# Patient Record
Sex: Male | Born: 1945 | Race: White | Hispanic: No | Smoking: Never smoker
Health system: Southern US, Community
[De-identification: ages and names within clinical notes are randomized; demographics above are authoritative.]

## PROBLEM LIST (undated history)

## (undated) DIAGNOSIS — E785 Hyperlipidemia, unspecified: Secondary | ICD-10-CM

## (undated) DIAGNOSIS — E119 Type 2 diabetes mellitus without complications: Secondary | ICD-10-CM

## (undated) HISTORY — PX: CHOLECYSTECTOMY: SHX55

## (undated) HISTORY — PX: HERNIA REPAIR: SHX51

## (undated) HISTORY — PX: CORONARY ARTERY BYPASS GRAFT: SHX141

## (undated) HISTORY — PX: APPENDECTOMY: SHX54

---

## 2004-09-13 ENCOUNTER — Ambulatory Visit: Payer: Self-pay | Admitting: Gastroenterology

## 2004-12-29 ENCOUNTER — Emergency Department: Payer: Self-pay | Admitting: Emergency Medicine

## 2006-09-26 ENCOUNTER — Emergency Department: Payer: Self-pay | Admitting: Emergency Medicine

## 2007-05-05 IMAGING — CR DG ANKLE COMPLETE 3+V*L*
1 series · 5 of 5 positions shown · non-contrast
Comparison: none

REASON FOR EXAM: Pain
COMMENTS:

[Series 1: view not recorded · 0.17mm/px · 5 of 5 slices shown]
[im 1/5]
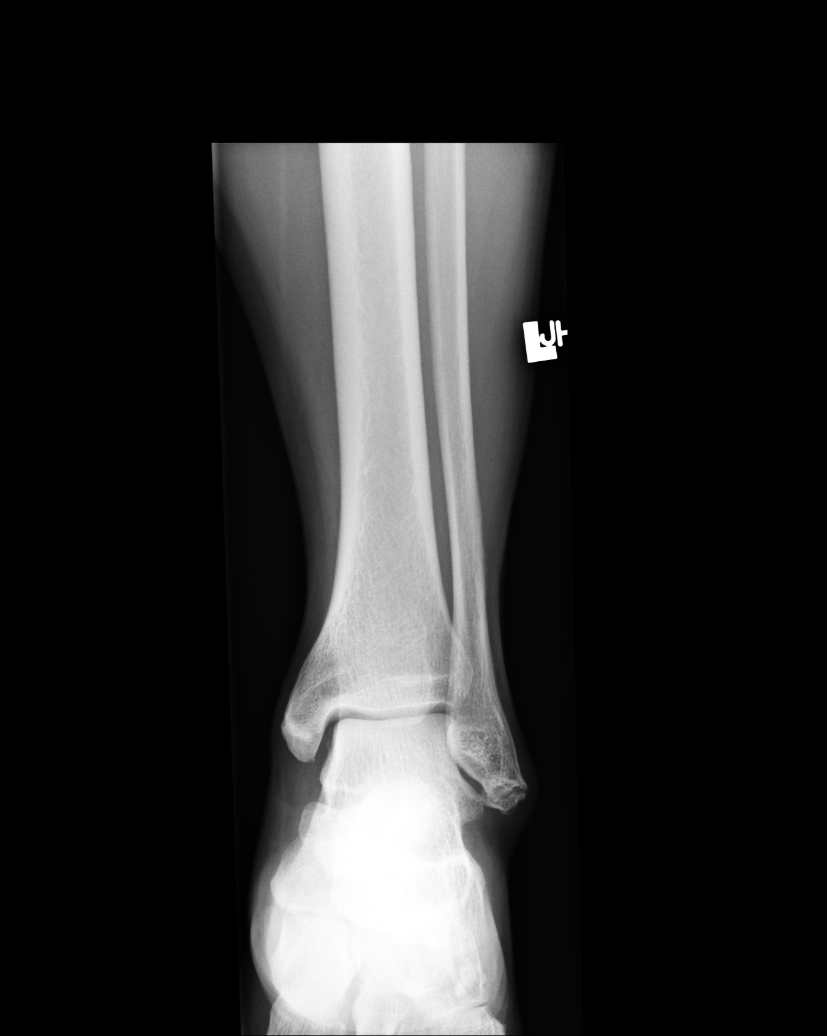
[im 2/5]
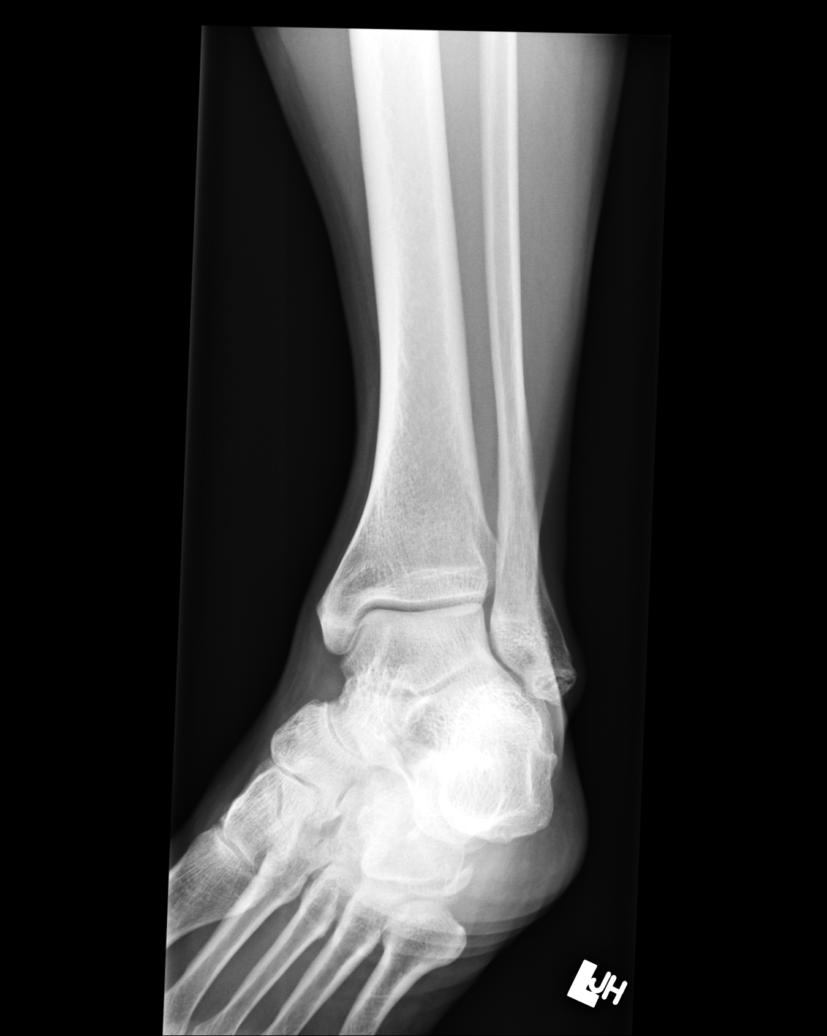
[im 3/5]
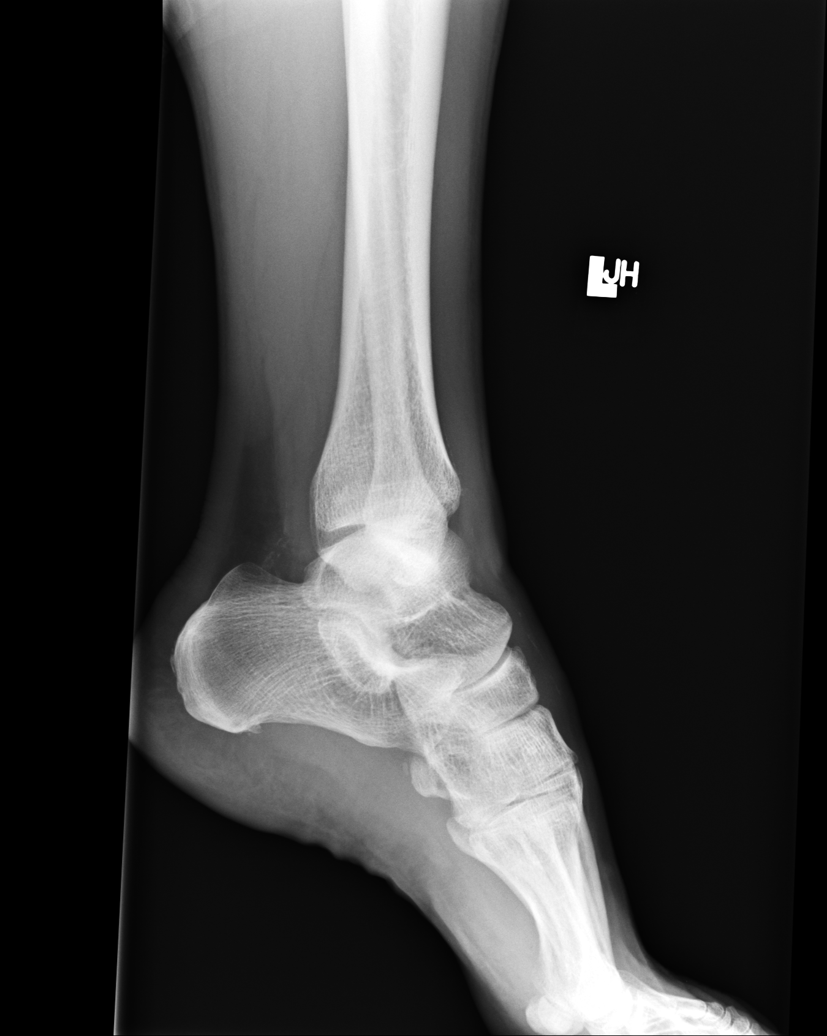
[im 4/5]
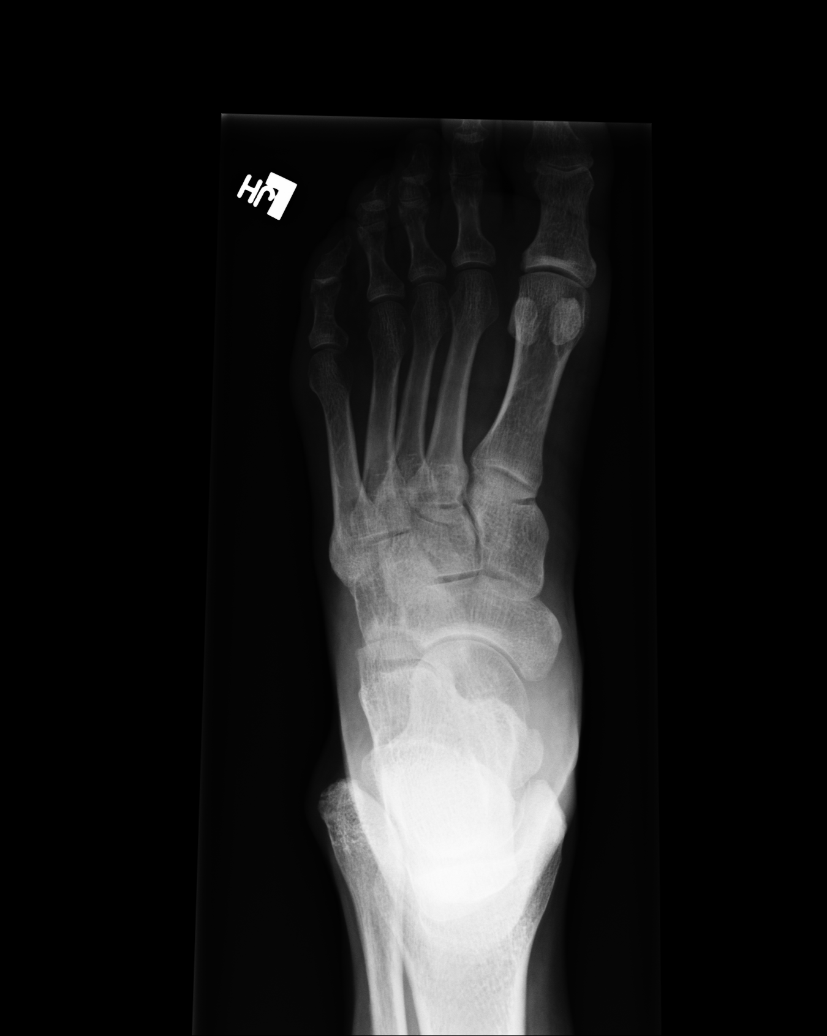
[im 5/5]
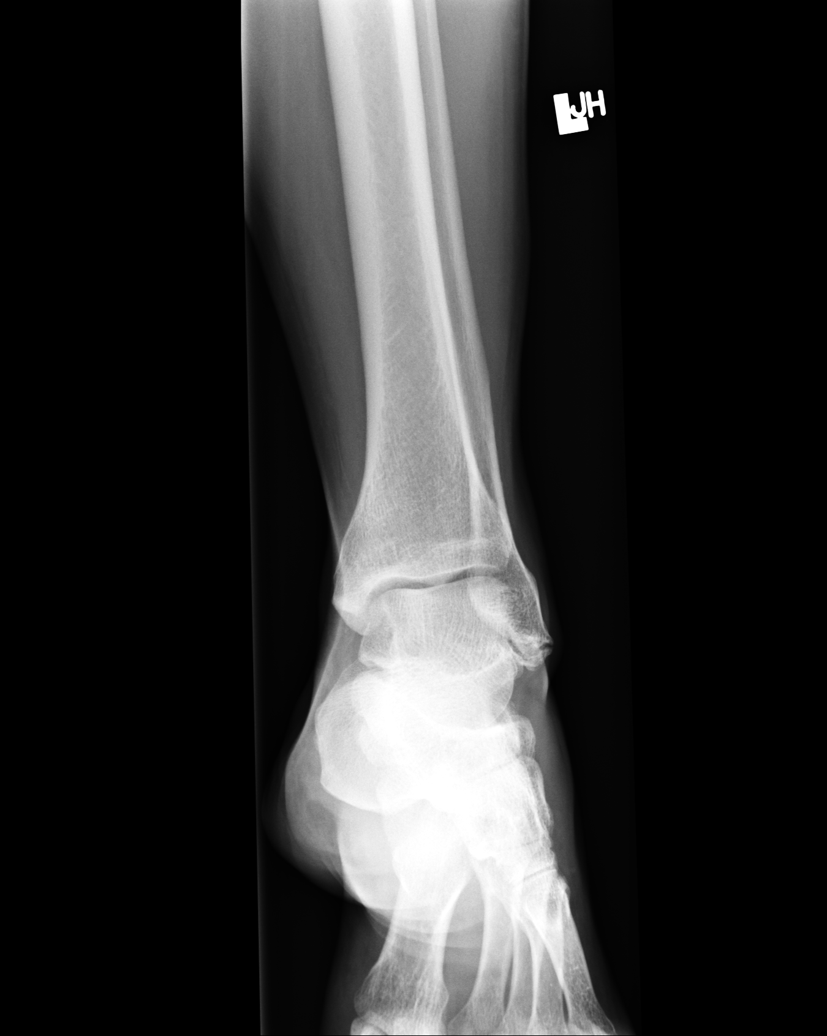

[5 of 5 positions shown; findings below may reference images not displayed]

PROCEDURE:     DXR - DXR ANKLE LEFT COMPLETE  - December 29, 2004 [DATE]

RESULT:          Multiple views of the LEFT ankle demonstrate degenerative
changes, possibly with a tiny avulsion at the tip of the lateral malleolus.
This may be old, given that it appears to be corticated.  Correlation with
symptoms and history would be recommended.  There does not appear to be
significant overlying soft tissue swelling.
IMPRESSION: Old avulsion at the tip of the lateral malleolus.  No
definite acute abnormality.

## 2008-09-12 ENCOUNTER — Ambulatory Visit: Payer: Self-pay | Admitting: Surgery

## 2008-09-20 ENCOUNTER — Ambulatory Visit: Payer: Self-pay | Admitting: Surgery

## 2009-11-01 DIAGNOSIS — I2510 Atherosclerotic heart disease of native coronary artery without angina pectoris: Secondary | ICD-10-CM | POA: Insufficient documentation

## 2011-01-17 IMAGING — CR DG CHEST 2V
1 series · 2 of 2 positions shown · non-contrast
Comparison: none

REASON FOR EXAM: [DATE]
COMMENTS:

PROCEDURE:     DXR - DXR CHEST PA (OR AP) AND LATERAL  - September 12, 2008  [DATE]
RESULT:     The lungs are adequately inflated. There is no focal infiltrate.
The heart is normal in size. The patient has undergone prior median
sternotomy. There is no pleural effusion.

[Series 1: view not recorded · 0.17mm/px · 2 of 2 slices shown]
[im 1/2]
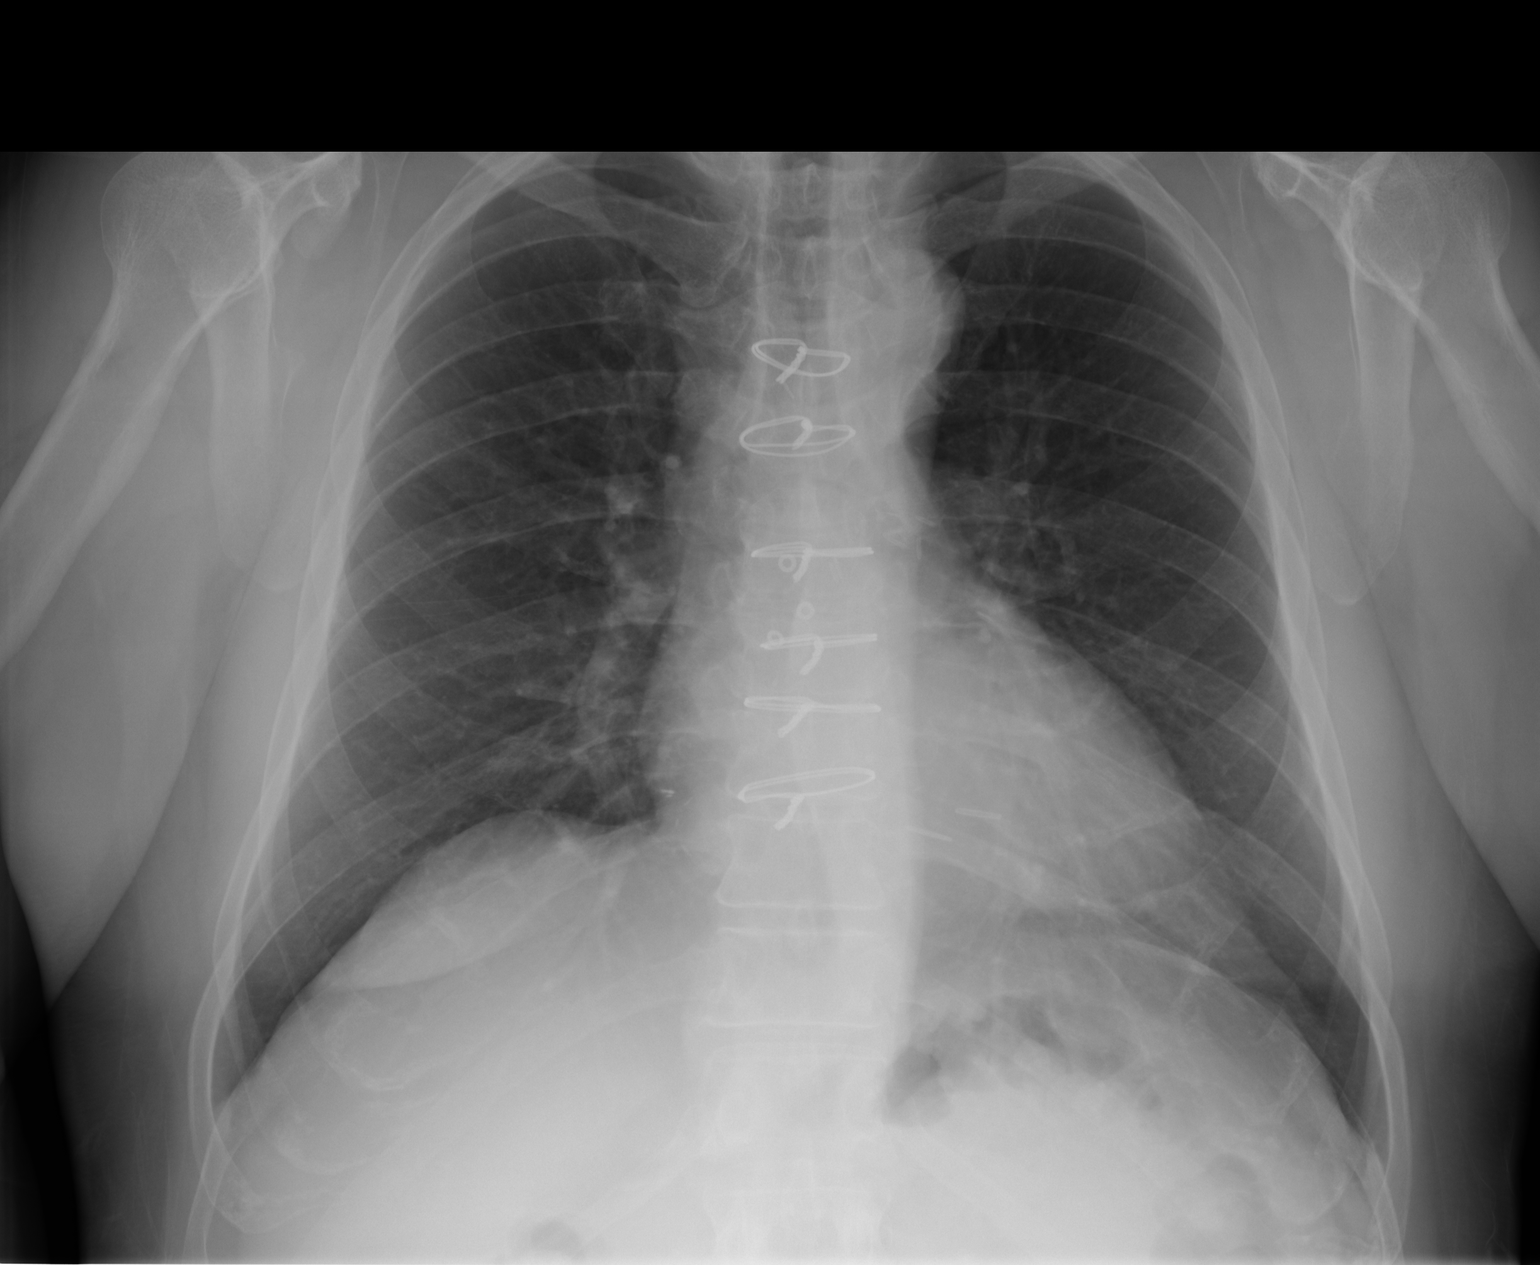
[im 2/2]
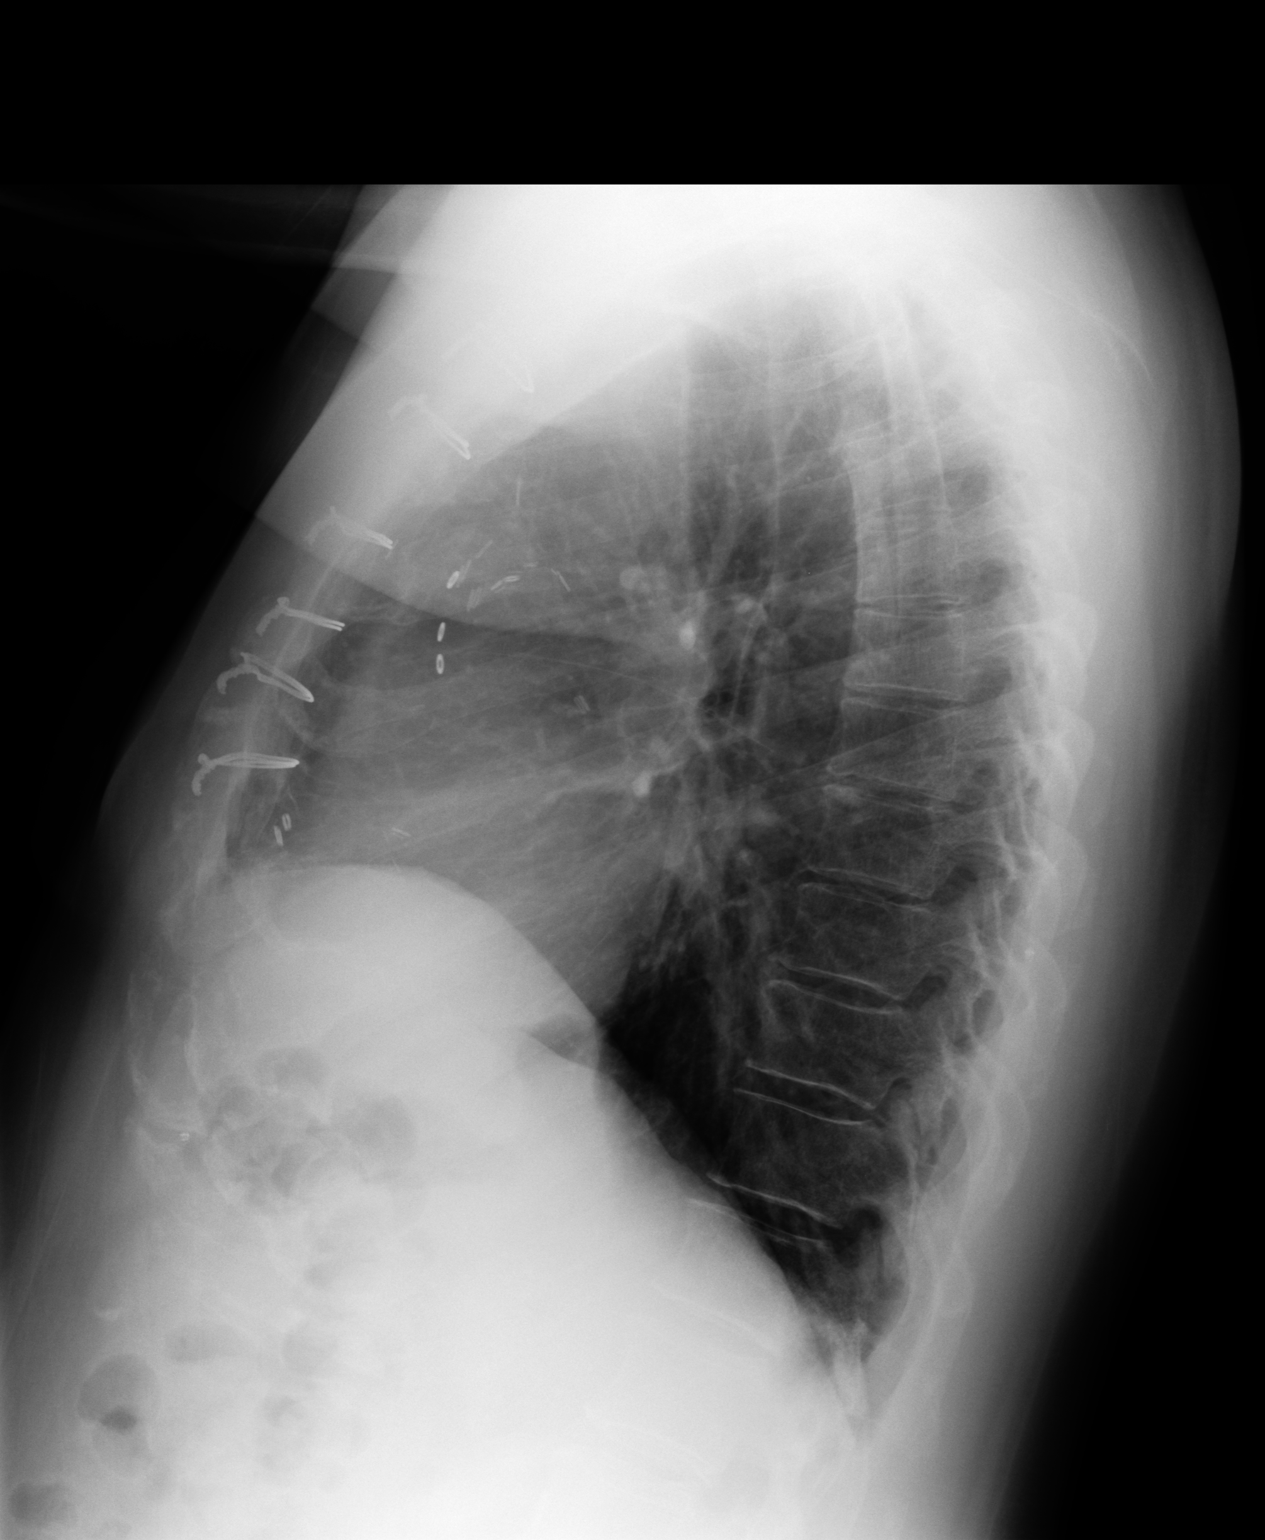

[2 of 2 positions shown; findings below may reference images not displayed]

IMPRESSION: I do not see evidence of CHF nor pneumonia nor other acute
cardiopulmonary abnormality.

## 2012-01-16 DIAGNOSIS — I252 Old myocardial infarction: Secondary | ICD-10-CM | POA: Insufficient documentation

## 2012-01-16 DIAGNOSIS — I5189 Other ill-defined heart diseases: Secondary | ICD-10-CM | POA: Insufficient documentation

## 2012-01-28 ENCOUNTER — Ambulatory Visit: Payer: Self-pay | Admitting: Family Medicine

## 2012-03-29 DIAGNOSIS — I6990 Unspecified sequelae of unspecified cerebrovascular disease: Secondary | ICD-10-CM | POA: Insufficient documentation

## 2012-03-29 DIAGNOSIS — Z951 Presence of aortocoronary bypass graft: Secondary | ICD-10-CM | POA: Insufficient documentation

## 2017-05-11 DIAGNOSIS — Z0000 Encounter for general adult medical examination without abnormal findings: Secondary | ICD-10-CM | POA: Insufficient documentation

## 2017-08-05 ENCOUNTER — Other Ambulatory Visit: Payer: Self-pay

## 2017-08-05 ENCOUNTER — Ambulatory Visit
Admission: EM | Admit: 2017-08-05 | Discharge: 2017-08-05 | Disposition: A | Discharge status: Home / Self Care | Payer: 59 | Attending: Emergency Medicine | Admitting: Emergency Medicine

## 2017-08-05 DIAGNOSIS — W57XXXA Bitten or stung by nonvenomous insect and other nonvenomous arthropods, initial encounter: Secondary | ICD-10-CM | POA: Diagnosis not present

## 2017-08-05 DIAGNOSIS — R21 Rash and other nonspecific skin eruption: Secondary | ICD-10-CM | POA: Diagnosis not present

## 2017-08-05 HISTORY — DX: Type 2 diabetes mellitus without complications: E11.9

## 2017-08-05 HISTORY — DX: Hyperlipidemia, unspecified: E78.5

## 2017-08-05 MED ORDER — DOXYCYCLINE HYCLATE 100 MG PO CAPS: 100.0000 mg | ORAL_CAPSULE | Freq: Two times a day (BID) | ORAL | 0 refills | Status: AC

## 2017-08-05 NOTE — Discharge Instructions (Addendum)
I am giving you information on [facility] spotted fever and Lyme disease so that you know what to look out for.  Finish the antibiotics, even if you feel better.  We did not test you today because the chances of it being falsely negative are fairly high because it is so soon after the tick bite and it would not change management.

## 2017-08-05 NOTE — ED Triage Notes (Signed)
Patient complains of a tick bite that occurred on Friday. Patient states that his wife removed the tick on Friday but think it had been on since Wednesday. Patient has area of bite with bullseye ring around it that he noticed today.

## 2017-08-05 NOTE — ED Provider Notes (Signed)
HPI  SUBJECTIVE:  Frederick Valdez. is a 72 y.o. male who presents with an annular erythematous nonpruritic, nonpainful, target lesion at the site of a tick bite to his posterior right torso.  Patient thinks that the tick was on for at least 48 hours, but his wife removed the tick 4 days ago.  He is not sure if it was engorged or not.  He states that he had surrounding erythema post removal but noticed a bull's-eye rash at the site of the tick bite today.  No flulike symptoms, body aches, headaches, fevers, neck stiffness, arthralgias, rash elsewhere, abdominal pain, chest pain, facial droop.  He has no complaints.  He tried an unknown ointment for itching, but the itching has resolved.  There are no other aggravating or alleviating factors.  The tick bite was sustained here [place].  No recent travel.  He has a past medical history of diabetes, MI status post quadruple bypass.  Is on antihypertensives but states that this is "to protect his heart".  No history of [facility] spotted fever, Lyme disease, MRSA.  PMD: Cannot remember the name.  Located in [place].    Past Medical History:  Diagnosis Date  . Diabetes mellitus (HCC)   . Hyperlipidemia     Past Surgical History:  Procedure Laterality Date  . APPENDECTOMY    . CHOLECYSTECTOMY    . CORONARY ARTERY BYPASS GRAFT    . HERNIA REPAIR      Family History  Problem Relation Age of Onset  . Pancreatic cancer Mother   . Heart disease Father     Social History   Tobacco Use  . Smoking status: Never Smoker  . Smokeless tobacco: Never Used  Substance Use Topics  . Alcohol use: Never    Frequency: Never  . Drug use: Never    No current facility-administered medications for this encounter.   Current Outpatient Medications:  .  amLODipine (NORVASC) 10 MG tablet, Take by mouth., Disp: , Rfl:  .  aspirin EC 81 MG tablet, Take by mouth., Disp: , Rfl:  .  atorvastatin (LIPITOR) 80 MG tablet, Take by mouth., Disp: ,  Rfl:  .  glucose blood (ONE TOUCH ULTRA TEST) test strip, USE TWICE DAILY AS DIRECTED, Disp: , Rfl:  .  insulin glargine (LANTUS) 100 UNIT/ML injection, Inject into the skin., Disp: , Rfl:  .  Insulin Pen Needle (BD PEN NEEDLE NANO U/F) 32G X 4 MM MISC, Use to inject insulin daily, Disp: , Rfl:  .  lisinopril (PRINIVIL,ZESTRIL) 20 MG tablet, Take by mouth., Disp: , Rfl:  .  metFORMIN (GLUCOPHAGE) 500 MG tablet, Take 1 tablet by mouth daily with breakfast, Disp: , Rfl:  .  nitroGLYCERIN (NITROSTAT) 0.4 MG SL tablet, Place under the tongue., Disp: , Rfl:  .  doxycycline (VIBRAMYCIN) 100 MG capsule, Take 1 capsule (100 mg total) by mouth 2 (two) times daily for 21 days., Disp: 42 capsule, Rfl: 0  Allergies  Allergen Reactions  . Niacin Other (See Comments)     ROS  As noted in HPI.   Physical Exam  BP (!) 151/74 (BP Location: Left Arm)   Pulse (!) 52   Temp 97.8 F (36.6 C) (Oral)   Resp 18   Ht 5\' 3"  (1.6 m)   Wt 185 lb (83.9 kg)   SpO2 99%   BMI 32.77 kg/m   Constitutional: Well developed, well nourished, no acute distress Eyes:  EOMI, conjunctiva normal bilaterally HENT: Normocephalic,  atraumatic,mucus membranes moist Respiratory: Normal inspiratory effort Cardiovascular: Normal rate GI: nondistended skin: +7.5 x 3 cm nontender blanchable circular rash with central clearing consistent with erythema migrans.  Central nontender tick bite with no expressible purulent drainage.  See picture.   Musculoskeletal: no deformities Neurologic: Alert & oriented x 3, no focal neuro deficits no facial droop. Psychiatric: Speech and behavior appropriate   ED Course   Medications - No data to display  No orders of the defined types were placed in this encounter.   No results found for this or any previous visit (from the past 24 hour(s)). No results found.  ED Clinical Impression  Tick bite, initial encounter  Rash   ED Assessment/Plan  Patient with erythema migrans  post tick bite.  It is too early to test for [facility] spotted fever and Lyme as it only has been 4 days, however, will be treating empirically for Lyme disease with doxycycline 100 mg p.o. twice daily for 3 weeks because of the rash which will also cover [facility] spotted fever.  Patient will need to follow-up with his primary care physician as needed.  Discussed  MDM, treatment plan, and plan for follow-up with patient. Discussed sn/sx that should prompt return to the ED. patient agrees with plan.   Meds ordered this encounter  Medications  . doxycycline (VIBRAMYCIN) 100 MG capsule    Sig: Take 1 capsule (100 mg total) by mouth 2 (two) times daily for 21 days.    Dispense:  42 capsule    Refill:  0    *This clinic note was created using Scientist, clinical (histocompatibility and immunogenetics)Dragon dictation software. Therefore, there may be occasional mistakes despite careful proofreading.   ?   Domenick GongMortenson, Jaley Yan, MD 08/05/17 1358

## 2021-07-13 ENCOUNTER — Ambulatory Visit (INDEPENDENT_AMBULATORY_CARE_PROVIDER_SITE_OTHER): Payer: Medicare HMO

## 2021-07-13 ENCOUNTER — Encounter: Payer: Self-pay | Admitting: Emergency Medicine

## 2021-07-13 ENCOUNTER — Ambulatory Visit
Admission: EM | Admit: 2021-07-13 | Discharge: 2021-07-13 | Disposition: A | Discharge status: Home / Self Care | Payer: Medicare HMO

## 2021-07-13 ENCOUNTER — Other Ambulatory Visit: Payer: Self-pay

## 2021-07-13 DIAGNOSIS — R059 Cough, unspecified: Secondary | ICD-10-CM

## 2021-07-13 DIAGNOSIS — J208 Acute bronchitis due to other specified organisms: Secondary | ICD-10-CM

## 2021-07-13 MED ORDER — AMOXICILLIN-POT CLAVULANATE 875-125 MG PO TABS: 1.0000 | ORAL_TABLET | Freq: Two times a day (BID) | ORAL | 0 refills | Status: DC

## 2021-07-13 MED ORDER — PREDNISONE 10 MG (21) PO TBPK: ORAL_TABLET | Freq: Every day | ORAL | 0 refills | Status: DC

## 2021-07-13 MED ORDER — ALBUTEROL SULFATE HFA 108 (90 BASE) MCG/ACT IN AERS: 1.0000 | INHALATION_SPRAY | Freq: Four times a day (QID) | RESPIRATORY_TRACT | 0 refills | Status: AC | PRN

## 2021-07-13 NOTE — ED Provider Notes (Signed)
[facility]    CSN: [id number] Arrival date & time: 07/13/21  1303      History   Chief Complaint Chief Complaint  Patient presents with   Cough    HPI Frederick Valdez. is a 76 y.o. male presenting with cough and congestion for 2 weeks.  History CAD, type 2 diabetes, hypertension.  Describes nonproductive cough, chest congestion for about 2 weeks.  There is no  nasal congestion, facial pressure, ear pain.  No shortness of breath, chest pain, new flank or back pain, fever/chills.  His PCP prescribed a cough syrup with minimal relief.  He denies history of COPD or asthma, never smoker.  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 Authorizing Provider  albuterol (VENTOLIN HFA) 108 (90 Base) MCG/ACT inhaler Inhale 1-2 puffs into the lungs every 6 (six) hours as needed for wheezing or shortness of breath. 07/13/21  Yes Rhys Martini, PA-C  amLODipine (NORVASC) 10 MG tablet Take by mouth. 02/17/17  Yes [provider]  amoxicillin-clavulanate (AUGMENTIN) 875-125 MG tablet Take 1 tablet by mouth every 12 (twelve) hours. Start the antibiotic if symptoms persist in 2 days (on Monday 6/5) 07/13/21  Yes Rhys Martini, PA-C  aspirin EC 81 MG  tablet Take by mouth. 12/25/14  Yes [provider]  atorvastatin (LIPITOR) 80 MG tablet Take by mouth. 02/17/17  Yes [provider]  insulin glargine (LANTUS) 100 UNIT/ML injection Inject into the skin. 07/03/16  Yes [provider]  lisinopril (PRINIVIL,ZESTRIL) 20 MG tablet Take by mouth. 02/17/17  Yes [provider]  metFORMIN (GLUCOPHAGE) 500 MG tablet Take 1 tablet by mouth daily with breakfast 02/17/17  Yes [provider]  predniSONE (STERAPRED UNI-PAK 21 TAB) 10 MG (21) TBPK tablet Take by mouth daily. Take 6 tabs by mouth daily  for 2 days, then 5 tabs for 2 days, then 4 tabs for 2 days, then 3 tabs for 2 days, 2 tabs for 2 days, then 1 tab by mouth daily for 2 days 07/13/21  Yes Rhys Martini, PA-C  chlorpheniramine-HYDROcodone 10-8 MG/5ML SMARTSIG:5 Milliliter(s) By Mouth Every 12 Hours PRN 07/05/21   [provider]  glucose blood (ONE TOUCH ULTRA TEST) test strip USE TWICE DAILY AS DIRECTED 02/18/16   [provider]  Insulin Pen Needle (BD PEN NEEDLE NANO U/F) 32G X 4 MM MISC Use to inject insulin daily 02/17/17   [provider]  nitroGLYCERIN (NITROSTAT) 0.4 MG SL tablet Place under the tongue. 07/10/17   [provider]    Family History Family History  Problem Relation Age of Onset   Pancreatic cancer Mother    Heart disease Father  Social History Social History   Tobacco Use   Smoking status: Never   Smokeless tobacco: Never  Vaping Use   Vaping Use: Never used  Substance Use Topics   Alcohol use: Never   Drug use: Never     Allergies   Niacin   Review of Systems Review of Systems  Constitutional:  Negative for appetite change, chills and fever.  HENT:  Positive for congestion. Negative for ear pain, rhinorrhea, sinus pressure, sinus pain and sore throat.   Eyes:  Negative for redness and visual disturbance.  Respiratory:  Positive for cough. Negative for chest tightness, shortness  of breath and wheezing.   Cardiovascular:  Negative for chest pain and palpitations.  Gastrointestinal:  Negative for abdominal pain, constipation, diarrhea, nausea and vomiting.  Genitourinary:  Negative for dysuria, frequency and urgency.  Musculoskeletal:  Negative for myalgias.  Neurological:  Negative for dizziness, weakness and headaches.  Psychiatric/Behavioral:  Negative for confusion.   All other systems reviewed and are negative.   Physical Exam Triage Vital Signs ED Triage Vitals  Enc Vitals Group     BP 07/13/21 1355 (!) 156/76     Pulse Rate 07/13/21 1355 80     Resp 07/13/21 1355 15     Temp 07/13/21 1355 98.3 F (36.8 C)     Temp Source 07/13/21 1355 Oral     SpO2 07/13/21 1355 97 %     Weight 07/13/21 1353 182 lb (82.6 kg)     Height 07/13/21 1353 5\' 3"  (1.6 m)     Head Circumference --      Peak Flow --      Pain Score 07/13/21 1353 0     Pain Loc --      Pain Edu? --      Excl. in GC? --    No data found.  Updated Vital Signs BP (!) 156/76 (BP Location: Right Arm)   Pulse 80   Temp 98.3 F (36.8 C) (Oral)   Resp 15   Ht 5\' 3"  (1.6 m)   Wt 182 lb (82.6 kg)   SpO2 97%   BMI 32.24 kg/m   Visual Acuity Right Eye Distance:   Left Eye Distance:   Bilateral Distance:    Right Eye Near:   Left Eye Near:    Bilateral Near:     Physical Exam Vitals reviewed.  Constitutional:      General: He is not in acute distress.    Appearance: Normal appearance. He is not ill-appearing.  HENT:     Head: Normocephalic and atraumatic.     Right Ear: Tympanic membrane, ear canal and external ear normal. No tenderness. No middle ear effusion. There is no impacted cerumen. Tympanic membrane is not perforated, erythematous, retracted or bulging.     Left Ear: Tympanic membrane, ear canal and external ear normal. No tenderness.  No middle ear effusion. There is no impacted cerumen. Tympanic membrane is not perforated, erythematous, retracted or bulging.     Nose:  Nose normal. No congestion.     Mouth/Throat:     Mouth: Mucous membranes are moist.     Pharynx: Uvula midline. No oropharyngeal exudate or posterior oropharyngeal erythema.  Eyes:     Extraocular Movements: Extraocular movements intact.     Pupils: Pupils are equal, round, and reactive to light.  Cardiovascular:     Rate and Rhythm: Normal rate and regular rhythm.     Heart sounds: Normal heart sounds.  Pulmonary:  Effort: Pulmonary effort is normal.     Breath sounds: Wheezing present. No decreased breath sounds, rhonchi or rales.     Comments: Expiratory wheezes throughout.  Oxygenating comfortably. Abdominal:     Palpations: Abdomen is soft.     Tenderness: There is no abdominal tenderness. There is no guarding or rebound.  Lymphadenopathy:     Cervical: No cervical adenopathy.     Right cervical: No superficial cervical adenopathy.    Left cervical: No superficial cervical adenopathy.  Neurological:     General: No focal deficit present.     Mental Status: He is alert and oriented to person, place, and time.  Psychiatric:        Mood and Affect: Mood normal.        Behavior: Behavior normal.        Thought Content: Thought content normal.        Judgment: Judgment normal.     UC Treatments / Results  Labs (all labs ordered are listed, but only abnormal results are displayed) Labs Reviewed - No data to display  EKG   Radiology DG Chest 2 View  Result Date: 07/13/2021 CLINICAL DATA:  Cough for 2 weeks. EXAM: CHEST - 2 VIEW COMPARISON:  Chest radiograph 09/12/2008 FINDINGS: Cardiomediastinal silhouette is stable. Heart size is normal. Median sternotomy wires. Lungs are clear except for thickening/density along the right minor fissure which could represent chronic changes. No pulmonary edema. No large pleural effusions. No acute bone abnormality. IMPRESSION: No active cardiopulmonary disease. Electronically Signed   By: Richarda Overlie M.D.   On: 07/13/2021 14:32     Procedures Procedures (including critical care time)  Medications Ordered in UC Medications - No data to display  Initial Impression / Assessment and Plan / UC Course  I have reviewed the triage vital signs and the nursing notes.  Pertinent labs & imaging results that were available during my care of the patient were reviewed by me and considered in my medical decision making (see chart for details).     This patient is a very pleasant 76 y.o. year old male presenting with viral bronchitis. Today this pt is afebrile nontachycardic nontachypneic, oxygenating well on room air, no wheezes rhonchi or rales. No history pulm ds.  CXR - No active cardiopulmonary disease.  His diabetes is currently well controlled, he states last A1c was 6.9.  We will manage as viral bronchitis with prednisone taper and albuterol inhaler.  If symptoms do not start to improve in 2 days, start the Augmentin.  He is in agreement.  ED return precautions discussed. Patient verbalizes understanding and agreement.    Final Clinical Impressions(s) / UC Diagnoses   Final diagnoses:  Viral bronchitis     Discharge Instructions      -Your chest xray looks normal.  -You have bronchitis. Bronchitis is an inflammation of the lining of your bronchial tubes, which carry air to and from your lungs. This typically occurs after a virus, like a cold virus. People who have bronchitis often cough up thickened mucus, which can be discolored. This isn't a bacterial infection, so you don't need antibiotics. We treat it with medications to help reduce inflammation and open up your lungs.  -Prednisone taper for cough/bronchitis. I recommend taking this in the morning as it could give you energy.  Avoid NSAIDs like ibuprofen and alleve while taking this medication as they can increase your risk of stomach upset and even GI bleeding when in combination with a steroid. You  can continue tylenol (acetaminophen) up to 1000mg  3x  daily. -Albuterol inhaler as needed for cough, wheezing, shortness of breath, 1 to 2 puffs every 6 hours as needed. -If symptoms persist in two days: -Start the antibiotic-Augmentin (amoxicillin-clavulanate), 1 pill every 12 hours for 7 days.  You can take this with food like with breakfast and dinner. -Follow-up with us or PCP if symptoms worsen/persist despite treatment.      ED Prescriptions     Medication Sig Dispense Auth. Provider   predniSONE (STERAPRED UNI-PAK 21 TAB) 10 MG (21) TBPK tablet Take by mouth daily. Take 6 tabs by mouth daily  for 2 days, then 5 tabs for 2 days, then 4 tabs for 2 days, then 3 tabs for 2 days, 2 tabs for 2 days, then 1 tab by mouth daily for 2 days 42 tablet Rhys MartiniGraham, Eon Zunker E, PA-C   amoxicillin-clavulanate (AUGMENTIN) 875-125 MG tablet Take 1 tablet by mouth every 12 (twelve) hours. Start the antibiotic if symptoms persist in 2 days (on Monday 6/5) 14 tablet Rhys MartiniGraham, Ilaisaane Marts E, PA-C   albuterol (VENTOLIN HFA) 108 (90 Base) MCG/ACT inhaler Inhale 1-2 puffs into the lungs every 6 (six) hours as needed for wheezing or shortness of breath. 1 each Rhys MartiniGraham, Ronaldo Crilly E, PA-C      PDMP not reviewed this encounter.   Rhys MartiniGraham, Loda Bialas E, PA-C 07/13/21 1511

## 2021-07-13 NOTE — ED Triage Notes (Signed)
Patient c/o cough and chest congestion for 2 weeks.  Patient states that he has had negative COVID test.  Patient was given cough medicine by his PCP.  Patient denies fevers.

## 2021-07-13 NOTE — Discharge Instructions (Addendum)
-  Your chest xray looks normal.  -You have bronchitis. Bronchitis is an inflammation of the lining of your bronchial tubes, which carry air to and from your lungs. This typically occurs after a virus, like a cold virus. People who have bronchitis often cough up thickened mucus, which can be discolored. This isn't a bacterial infection, so you don't need antibiotics. We treat it with medications to help reduce inflammation and open up your lungs.  -Prednisone taper for cough/bronchitis. I recommend taking this in the morning as it could give you energy.  Avoid NSAIDs like ibuprofen and alleve while taking this medication as they can increase your risk of stomach upset and even GI bleeding when in combination with a steroid. You can continue tylenol (acetaminophen) up to 1000mg  3x daily. -Albuterol inhaler as needed for cough, wheezing, shortness of breath, 1 to 2 puffs every 6 hours as needed. -If symptoms persist in two days: -Start the antibiotic-Augmentin (amoxicillin-clavulanate), 1 pill every 12 hours for 7 days.  You can take this with food like with breakfast and dinner. -Follow-up with or PCP if symptoms worsen/persist despite treatment.

## 2021-09-30 ENCOUNTER — Ambulatory Visit (INDEPENDENT_AMBULATORY_CARE_PROVIDER_SITE_OTHER): Payer: Medicare HMO

## 2021-09-30 ENCOUNTER — Ambulatory Visit
Admission: EM | Admit: 2021-09-30 | Discharge: 2021-09-30 | Disposition: A | Discharge status: Home / Self Care | Payer: Medicare HMO | Attending: Emergency Medicine | Admitting: Emergency Medicine

## 2021-09-30 DIAGNOSIS — R059 Cough, unspecified: Secondary | ICD-10-CM

## 2021-09-30 DIAGNOSIS — Z20822 Contact with and (suspected) exposure to covid-19: Secondary | ICD-10-CM | POA: Insufficient documentation

## 2021-09-30 DIAGNOSIS — R052 Subacute cough: Secondary | ICD-10-CM | POA: Insufficient documentation

## 2021-09-30 LAB — SARS CORONAVIRUS 2 BY RT PCR: SARS Coronavirus 2 by RT PCR: NEGATIVE

## 2021-09-30 MED ORDER — FAMOTIDINE 20 MG PO TABS: 20.0000 mg | ORAL_TABLET | Freq: Two times a day (BID) | ORAL | 0 refills | Status: DC

## 2021-09-30 MED ORDER — AEROCHAMBER MV MISC
1 refills | Status: AC
Start: 2021-09-30 — End: ?

## 2021-09-30 MED ORDER — FLUTICASONE PROPIONATE 50 MCG/ACT NA SUSP: 2.0000 | Freq: Every day | NASAL | 0 refills | Status: DC

## 2021-09-30 MED ORDER — PROMETHAZINE-DM 6.25-15 MG/5ML PO SYRP: 5.0000 mL | ORAL_SOLUTION | Freq: Four times a day (QID) | ORAL | 0 refills | Status: DC | PRN

## 2021-09-30 NOTE — ED Provider Notes (Signed)
HPI  SUBJECTIVE:  Frederick Valdez. is a 76 y.o. male who presents with a cough since 6/3, when he was diagnosed with bronchitis.  He was sent home with a prednisone taper and albuterol inhaler, Augmentin.  He states that the cough improved, but never completely resolved.  He got acutely worse 3 days ago.  States that he was unable to sleep at night secondary to the cough.  He reports a mild sore throat, and hoarseness but attributes this to coughing.  No known COVID exposure.  He got 3 doses of the COVID-vaccine.  He took Augmentin within the past 3 months for bronchitis.  No antipyretic in the past 6 hours.  He tried Delsym with some improvement in his symptoms.  Symptoms are worse with talking.  He has a past medical history of MI status post CABG, well-controlled diabetes, hypertension on lisinopril, hypercholesterolemia.  No history of CHF, cancer, GERD, pulmonary disease, smoking.  PCP: [facility]   His last A1c was 7.1 in April 2023    Past Medical History:  Diagnosis Date   Diabetes mellitus (HCC)    Hyperlipidemia     Past Surgical History:  Procedure Laterality Date   APPENDECTOMY     CHOLECYSTECTOMY     CORONARY ARTERY BYPASS GRAFT     HERNIA REPAIR      Family History  Problem Relation Age of Onset   Pancreatic cancer Mother    Heart disease Father     Social History   Tobacco Use   Smoking status: Never   Smokeless tobacco: Never  Vaping Use   Vaping Use: Never used  Substance Use Topics   Alcohol use: Never   Drug use: Never    No current facility-administered medications for this encounter.  Current Outpatient Medications:    amLODipine (NORVASC) 10 MG tablet, Take by mouth., Disp: , Rfl:    aspirin EC 81 MG tablet, Take by mouth., Disp: , Rfl:    atorvastatin (LIPITOR) 80 MG tablet, Take by mouth., Disp: , Rfl:    famotidine (PEPCID) 20 MG tablet, Take 1 tablet (20 mg total) by mouth 2 (two) times daily., Disp: 40 tablet, Rfl: 0   fluticasone  (FLONASE) 50 MCG/ACT nasal spray, Place 2 sprays into both nostrils daily., Disp: 16 g, Rfl: 0   glucose blood (ONE TOUCH ULTRA TEST) test strip, USE TWICE DAILY AS DIRECTED, Disp: , Rfl:    insulin glargine (LANTUS) 100 UNIT/ML injection, Inject into the skin., Disp: , Rfl:    Insulin Pen Needle (BD PEN NEEDLE NANO U/F) 32G X 4 MM MISC, Use to inject insulin daily, Disp: , Rfl:    lisinopril (PRINIVIL,ZESTRIL) 20 MG tablet, Take by mouth., Disp: , Rfl:    metFORMIN (GLUCOPHAGE) 500 MG tablet, Take 1 tablet by mouth daily with breakfast, Disp: , Rfl:    nitroGLYCERIN (NITROSTAT) 0.4 MG SL tablet, Place under the tongue., Disp: , Rfl:    promethazine-dextromethorphan (PROMETHAZINE-DM) 6.25-15 MG/5ML syrup, Take 5 mLs by mouth 4 (four) times daily as needed for cough., Disp: 118 mL, Rfl: 0   Spacer/Aero-Holding Chambers (AEROCHAMBER MV) inhaler, Use as instructed, Disp: 1 each, Rfl: 1   albuterol (VENTOLIN HFA) 108 (90 Base) MCG/ACT inhaler, Inhale 1-2 puffs into the lungs every 6 (six) hours as needed for wheezing or shortness of breath., Disp: 1 each, Rfl: 0  Allergies  Allergen Reactions   Niacin Other (See Comments)     ROS  As noted in HPI.  Physical Exam  BP (!) 143/63 (BP Location: Left Arm)   Pulse (!) 58   Temp 98.4 F (36.9 C) (Oral)   Ht 5\' 3"  (1.6 m)   Wt 79.4 kg   SpO2 97%   BMI 31.00 kg/m   Constitutional: Well developed, well nourished, no acute distress.  Coughing. Eyes:  EOMI, conjunctiva normal bilaterally HENT: Normocephalic, atraumatic,mucus membranes moist.  Positive nasal congestion.  Normal turbinates.  No maxillary, frontal sinus tenderness.  Unable to visualize oropharynx. Respiratory: Normal inspiratory effort lungs clear bilaterally, good air movement.  No anterior, lateral chest wall tenderness Cardiovascular: Normal rate, regular rhythm, no murmurs rubs or gallops GI: nondistended skin: No rash, skin intact Musculoskeletal: Calves symmetric,  nontender, no edema Neurologic: Alert & oriented x 3, no focal neuro deficits Psychiatric: Speech and behavior appropriate   ED Course   Medications - No data to display  Orders Placed This Encounter  Procedures   SARS Coronavirus 2 by RT PCR (hospital order, performed in [facility] hospital lab) *cepheid single result test* Anterior Nasal Swab    Standing Status:   Standing    Number of Occurrences:   1   DG Chest 2 View    Standing Status:   Standing    Number of Occurrences:   1    Order Specific Question:   Reason for Exam (SYMPTOM  OR DIAGNOSIS REQUIRED)    Answer:   Cough for month, month and a half.  Rule out bronchitis, pneumonia, pulmonary edema, pleural effusion   Ambulatory referral to Pulmonology    Referral Priority:   Routine    Referral Type:   Consultation    Referral Reason:   Specialty Services Required    Requested Specialty:   Pulmonary Disease    Number of Visits Requested:   1    Results for orders placed or performed during the hospital encounter of 09/30/21 (from the past 24 hour(s))  SARS Coronavirus 2 by RT PCR (hospital order, performed in [facility] hospital lab) *cepheid single result test* Anterior Nasal Swab     Status: None   Collection Time: 09/30/21  8:27 AM   Specimen: Anterior Nasal Swab  Result Value Ref Range   SARS Coronavirus 2 by RT PCR NEGATIVE NEGATIVE   DG Chest 2 View  Result Date: 09/30/2021 CLINICAL DATA:  Cough EXAM: CHEST - 2 VIEW COMPARISON:  07/13/2021 chest radiograph. FINDINGS: Intact sternotomy wires. Stable cardiomediastinal silhouette with normal heart size. No pneumothorax. No pleural effusion. Lungs appear clear, with no acute consolidative airspace disease and no pulmonary edema. IMPRESSION: No active cardiopulmonary disease. Electronically Signed   By: 09/12/2021 M.D.   On: 09/30/2021 08:38    ED Clinical Impression  1. Subacute cough      ED Assessment/Plan  Outside labs reviewed.  As noted in  HPI.  Patient with a cough.  In the differential includes infectious cause such as COVID, pneumonia, bronchitis, medication causes as he takes lisinopril, GERD.  Doubt CHF, PE.  Will check a chest x-ray and a COVID since he got acutely worse 3 days ago and he has multiple medical comorbidities.  Reviewed imaging independently.  No pulmonary edema, effusion, consolidative airspace disease.  See radiology report for full details.  Patient with a subacute cough.  Suspect postnasal drip, lisinopril, or GERD causing cough.  Chest x-ray is normal, COVID PCR is negative.  His lungs are clear.  He is satting 97% on room air.  We will try some  Promethazine DM, a spacer for his albuterol inhaler.  We can try some Pepcid.  He is to follow-up with his PCP and discuss lisinopril.  will refer to pulmonology for further evaluation.  Discussed labs, imaging, MDM, treatment plan, and plan for follow-up with patient. Discussed sn/sx that should prompt return to the ED. patient agrees with plan.   Meds ordered this encounter  Medications   fluticasone (FLONASE) 50 MCG/ACT nasal spray    Sig: Place 2 sprays into both nostrils daily.    Dispense:  16 g    Refill:  0   Spacer/Aero-Holding Chambers (AEROCHAMBER MV) inhaler    Sig: Use as instructed    Dispense:  1 each    Refill:  1   promethazine-dextromethorphan (PROMETHAZINE-DM) 6.25-15 MG/5ML syrup    Sig: Take 5 mLs by mouth 4 (four) times daily as needed for cough.    Dispense:  118 mL    Refill:  0   famotidine (PEPCID) 20 MG tablet    Sig: Take 1 tablet (20 mg total) by mouth 2 (two) times daily.    Dispense:  40 tablet    Refill:  0      *This clinic note was created using Scientist, clinical (histocompatibility and immunogenetics). Therefore, there may be occasional mistakes despite careful proofreading.  ?    Domenick Gong, MD 10/01/21 [phone]

## 2021-09-30 NOTE — ED Triage Notes (Signed)
Pt c/o cough, pt states he was seen in June for bronchitis and feels like it's been lingering since then, pt states cough has been worse in mornings but yesterday all day. Cough mostly dry

## 2021-09-30 NOTE — Discharge Instructions (Addendum)
Chest x-ray is normal.  If it is negative.  I suspect postnasal drip, your blood pressure medication, lisinopril, or acid reflux causing a cough.  I am sending you home Flonase for postnasal drip, Pepcid for possible acid reflux, Promethazine DM to help quiet your cough.  Please follow-up with your primary care provider to discuss your lisinopril ASAP.  You can take 2 puffs from your albuterol inhaler using your spacer every 4 hours as needed.  I have also put in a referral to pulmonology for further evaluation.  Go to the ER for the signs and symptoms we discussed

## 2022-02-28 ENCOUNTER — Ambulatory Visit
Admission: EM | Admit: 2022-02-28 | Discharge: 2022-02-28 | Disposition: A | Discharge status: Home / Self Care | Payer: Medicare HMO | Attending: Physician Assistant | Admitting: Physician Assistant

## 2022-02-28 ENCOUNTER — Ambulatory Visit (INDEPENDENT_AMBULATORY_CARE_PROVIDER_SITE_OTHER): Payer: Medicare HMO

## 2022-02-28 DIAGNOSIS — Z7984 Long term (current) use of oral hypoglycemic drugs: Secondary | ICD-10-CM | POA: Diagnosis not present

## 2022-02-28 DIAGNOSIS — R062 Wheezing: Secondary | ICD-10-CM | POA: Insufficient documentation

## 2022-02-28 DIAGNOSIS — R051 Acute cough: Secondary | ICD-10-CM | POA: Insufficient documentation

## 2022-02-28 DIAGNOSIS — E119 Type 2 diabetes mellitus without complications: Secondary | ICD-10-CM | POA: Diagnosis not present

## 2022-02-28 DIAGNOSIS — E785 Hyperlipidemia, unspecified: Secondary | ICD-10-CM | POA: Diagnosis not present

## 2022-02-28 DIAGNOSIS — I10 Essential (primary) hypertension: Secondary | ICD-10-CM | POA: Insufficient documentation

## 2022-02-28 DIAGNOSIS — Z1152 Encounter for screening for COVID-19: Secondary | ICD-10-CM | POA: Insufficient documentation

## 2022-02-28 DIAGNOSIS — Z794 Long term (current) use of insulin: Secondary | ICD-10-CM | POA: Insufficient documentation

## 2022-02-28 DIAGNOSIS — B338 Other specified viral diseases: Secondary | ICD-10-CM

## 2022-02-28 DIAGNOSIS — I252 Old myocardial infarction: Secondary | ICD-10-CM | POA: Insufficient documentation

## 2022-02-28 DIAGNOSIS — B974 Respiratory syncytial virus as the cause of diseases classified elsewhere: Secondary | ICD-10-CM | POA: Insufficient documentation

## 2022-02-28 DIAGNOSIS — R0981 Nasal congestion: Secondary | ICD-10-CM | POA: Diagnosis not present

## 2022-02-28 DIAGNOSIS — I2510 Atherosclerotic heart disease of native coronary artery without angina pectoris: Secondary | ICD-10-CM | POA: Insufficient documentation

## 2022-02-28 LAB — RESP PANEL BY RT-PCR (RSV, FLU A&B, COVID)  RVPGX2
Influenza A by PCR: NEGATIVE
Influenza B by PCR: NEGATIVE
Resp Syncytial Virus by PCR: POSITIVE — AB
SARS Coronavirus 2 by RT PCR: NEGATIVE

## 2022-02-28 MED ORDER — PROMETHAZINE-DM 6.25-15 MG/5ML PO SYRP: 5.0000 mL | ORAL_SOLUTION | Freq: Four times a day (QID) | ORAL | 0 refills | Status: DC | PRN

## 2022-02-28 NOTE — ED Provider Notes (Signed)
[facility]    CSN: [id number] Arrival date & time: 02/28/22  0827      History   Chief Complaint Chief Complaint  Patient presents with   Cough   Nasal Congestion    HPI Frederick Valdez. is a 77 y.o. male presenting for cough, congestion, sore throat, wheezing, fatigue, diarrhea, and aches.  Symptom onset was yesterday with symptoms worsening last night.  He denies fever, shortness of breath, vomiting.  No sick contacts.  Taking Delsym without relief.  Patient believes he may have bronchitis.  States he felt similarly last year.  No other concerns.  Medical history significant for diabetes, previous MI, CAD, hypertension, and hyperlipidemia.  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 Authorizing Provider  amLODipine (NORVASC) 10 MG tablet Take by mouth. 02/17/17  Yes [provider]  aspirin EC 81 MG tablet Take by mouth. 12/25/14  Yes [provider]  atorvastatin (LIPITOR) 80 MG tablet Take by mouth. 02/17/17  Yes [provider]  glucose blood (ONE TOUCH ULTRA TEST) test strip USE TWICE DAILY AS DIRECTED 02/18/16  Yes [provider]  insulin glargine (LANTUS) 100 UNIT/ML injection Inject into the  skin. 07/03/16  Yes [provider]  Insulin Pen Needle (BD PEN NEEDLE NANO U/F) 32G X 4 MM MISC Use to inject insulin daily 02/17/17  Yes [provider]  lisinopril (PRINIVIL,ZESTRIL) 20 MG tablet Take by mouth. 02/17/17  Yes [provider]  metFORMIN (GLUCOPHAGE) 500 MG tablet Take 1 tablet by mouth daily with breakfast 02/17/17  Yes [provider]  nitroGLYCERIN (NITROSTAT) 0.4 MG SL tablet Place under the tongue. 07/10/17  Yes [provider]  tamsulosin (FLOMAX) 0.4 MG CAPS capsule Take 0.4 mg by mouth daily.   Yes [provider]  traZODone (DESYREL) 50 MG tablet Take 50 mg by mouth at bedtime.   Yes [provider]  albuterol (VENTOLIN HFA) 108 (90 Base) MCG/ACT inhaler Inhale 1-2 puffs into the lungs every 6 (six) hours as needed for wheezing or shortness of breath. 07/13/21   [name], PA-C  famotidine (PEPCID) 20 MG tablet Take 1 tablet (20 mg total) by mouth 2 (two) times daily. 09/30/21   [name], MD  fluticasone (FLONASE) 50 MCG/ACT nasal spray Place 2 sprays into both nostrils daily. 09/30/21   [name], MD  promethazine-dextromethorphan (PROMETHAZINE-DM) 6.25-15 MG/5ML syrup Take 5 mLs by mouth 4 (four) times daily as needed for cough. 02/28/22   Danton Clap, PA-C  Spacer/Aero-Holding Chambers (AEROCHAMBER MV) inhaler Use as instructed 09/30/21   [name], MD    Family History Family History  Problem Relation Age of Onset   Pancreatic  cancer Mother    Heart disease Father     Social History Social History   Tobacco Use   Smoking status: Never   Smokeless tobacco: Never  Vaping Use   Vaping Use: Never used  Substance Use Topics   Alcohol use: Never   Drug use: Never     Allergies   Niacin   Review of Systems Review of Systems  Constitutional:  Positive for fatigue. Negative for fever.  HENT:  Positive for congestion, rhinorrhea and sore throat. Negative for sinus pressure  and sinus pain.   Respiratory:  Positive for cough and wheezing. Negative for shortness of breath.   Cardiovascular:  Negative for chest pain.  Gastrointestinal:  Positive for diarrhea. Negative for abdominal pain, nausea and vomiting.  Musculoskeletal:  Positive for myalgias.  Neurological:  Negative for weakness, light-headedness and headaches.  Hematological:  Negative for adenopathy.     Physical Exam Triage Vital Signs ED Triage Vitals  Enc Vitals Group     BP      Pulse      Resp      Temp      Temp src      SpO2      Weight      Height      Head Circumference      Peak Flow      Pain Score      Pain Loc      Pain Edu?      Excl. in GC?    No data found.  Updated Vital Signs BP (!) 149/70 (BP Location: Left Arm)   Pulse 61   Temp 98 F (36.7 C) (Oral)   Resp 18   Ht 5\' 3"  (1.6 m)   Wt 175 lb (79.4 kg)   SpO2 100%   BMI 31.00 kg/m      Physical Exam Vitals and nursing note reviewed.  Constitutional:      General: He is not in acute distress.    Appearance: Normal appearance. He is well-developed. He is not ill-appearing.  HENT:     Head: Normocephalic and atraumatic.     Nose: Congestion present.     Mouth/Throat:     Mouth: Mucous membranes are moist.     Pharynx: Oropharynx is clear.  Eyes:     General: No scleral icterus.    Conjunctiva/sclera: Conjunctivae normal.  Cardiovascular:     Rate and Rhythm: Normal rate and regular rhythm.  Pulmonary:     Effort: Pulmonary effort is normal. No respiratory distress.     Breath sounds: Normal breath sounds. No wheezing, rhonchi or rales.  Musculoskeletal:     Cervical back: Neck supple.  Skin:    General: Skin is warm and dry.     Capillary Refill: Capillary refill takes less than 2 seconds.  Neurological:     General: No focal deficit present.     Mental Status: He is alert. Mental status is at baseline.     Motor: No weakness.     Gait: Gait normal.  Psychiatric:        Mood and Affect: Mood  normal.        Behavior: Behavior normal.      UC Treatments / Results  Labs (all labs ordered are listed, but only abnormal results are displayed) Labs Reviewed  RESP PANEL BY RT-PCR (RSV, FLU A&B, COVID)  RVPGX2 - Abnormal; Notable for the following components:      Result Value  Resp Syncytial Virus by PCR POSITIVE (*)    All other components within normal limits    EKG   Radiology DG Chest 2 View  Result Date: 02/28/2022 CLINICAL DATA:  Cough, congestion, and wheezing for 1 day EXAM: CHEST - 2 VIEW COMPARISON:  Chest x-ray September 30, 2021 FINDINGS: The cardiomediastinal silhouette is unchanged in contour. Unchanged focal linear opacity in the lingula, likely atelectasis/scarring. No new focal pulmonary opacity. No pleural effusion or pneumothorax. The visualized upper abdomen is unremarkable. No acute osseous abnormality. IMPRESSION: No acute cardiopulmonary abnormality. Electronically Signed   By: Beryle Flock M.D.   On: 02/28/2022 09:18    Procedures Procedures (including critical care time)  Medications Ordered in UC Medications - No data to display  Initial Impression / Assessment and Plan / UC Course  I have reviewed the triage vital signs and the nursing notes.  Pertinent labs & imaging results that were available during my care of the patient were reviewed by me and considered in my medical decision making (see chart for details).   77 year old male presents for fatigue, cough, congestion, sore throat and wheezing that began yesterday.  No fever or breathing difficulty.  He is afebrile and overall well-appearing.  No acute distress.  On exam he has nasal congestion.  Throat is clear.  Chest clear to auscultation and heart regular rate and rhythm.  Respiratory panel and chest x-ray obtained.  Chest x-ray without any acute cardiopulmonary abnormality.   +RSV.  Sent Promethazine DM to pharmacy.  He says that he received this cough medication last year and it  was very helpful to him.  Encouraged him to increase his rest and fluid intake.  Advised patient he could be sick for couple of weeks but if he develops a fever or shortness of breath to return.  Reviewed return and ER precautions thoroughly.  Final Clinical Impressions(s) / UC Diagnoses   Final diagnoses:  RSV (respiratory syncytial virus infection)  Acute cough  Nasal congestion  Wheezing     Discharge Instructions      -Your chest x-ray does not show any evidence of pneumonia. - You do have RSV which is a respiratory viral infection.  You may be sick for couple of weeks.  If you start to have a fever or shortness of breath you should return, otherwise take the cough medication and rest and increase your fluids.  URI/COLD SYMPTOMS: Your exam today is consistent with a viral illness. Antibiotics are not indicated at this time. Use medications as directed, including cough syrup, nasal saline, and decongestants. Your symptoms should improve over the next few days and resolve within 7-10 days. Increase rest and fluids. F/u if symptoms worsen or predominate such as sore throat, ear pain, productive cough, shortness of breath, or if you develop high fevers or worsening fatigue over the next several days.       ED Prescriptions     Medication Sig Dispense Auth. Provider   promethazine-dextromethorphan (PROMETHAZINE-DM) 6.25-15 MG/5ML syrup Take 5 mLs by mouth 4 (four) times daily as needed for cough. 118 mL Danton Clap, PA-C      PDMP not reviewed this encounter.   Danton Clap, PA-C 02/28/22 [phone]

## 2022-02-28 NOTE — Discharge Instructions (Signed)
-  Your chest x-ray does not show any evidence of pneumonia. - You do have RSV which is a respiratory viral infection.  You may be sick for couple of weeks.  If you start to have a fever or shortness of breath you should return, otherwise take the cough medication and rest and increase your fluids.  URI/COLD SYMPTOMS: Your exam today is consistent with a viral illness. Antibiotics are not indicated at this time. Use medications as directed, including cough syrup, nasal saline, and decongestants. Your symptoms should improve over the next few days and resolve within 7-10 days. Increase rest and fluids. F/u if symptoms worsen or predominate such as sore throat, ear pain, productive cough, shortness of breath, or if you develop high fevers or worsening fatigue over the next several days.

## 2022-02-28 NOTE — ED Triage Notes (Signed)
Pt c/o possible bronchitis  Pt is having cough, sore throat, congestion, rattling in lungs at night when laying down x1day  Pt is taking delsym and states it is not working.

## 2022-09-04 ENCOUNTER — Telehealth: Payer: Medicare HMO | Admitting: Physician Assistant

## 2022-09-04 DIAGNOSIS — J209 Acute bronchitis, unspecified: Secondary | ICD-10-CM

## 2022-09-04 MED ORDER — DOXYCYCLINE HYCLATE 100 MG PO TABS: 100.0000 mg | ORAL_TABLET | Freq: Two times a day (BID) | ORAL | 0 refills | Status: AC

## 2022-09-04 MED ORDER — BENZONATATE 100 MG PO CAPS: 100.0000 mg | ORAL_CAPSULE | Freq: Three times a day (TID) | ORAL | 0 refills | Status: AC | PRN

## 2022-09-04 NOTE — Patient Instructions (Signed)
Frederick Bills., thank you for joining Piedad Climes, PA-C for today's virtual visit.  While this provider is not your primary care provider (PCP), if your PCP is located in our provider database this encounter information will be shared with them immediately following your visit.   A [facility] MyChart account gives you access to today's visit and all your visits, tests, and labs performed at [facility] " click here if you don't have a [facility] MyChart account or go to [email or web address]  Consent: (Patient) Frederick Bills. provided verbal consent for this virtual visit at the beginning of the encounter.  Current Medications:  Current Outpatient Medications:    albuterol (VENTOLIN HFA) 108 (90 Base) MCG/ACT inhaler, Inhale 1-2 puffs into the lungs every 6 (six) hours as needed for wheezing or shortness of breath., Disp: 1 each, Rfl: 0   amLODipine (NORVASC) 10 MG tablet, Take by mouth., Disp: , Rfl:    aspirin EC 81 MG tablet, Take by mouth., Disp: , Rfl:    atorvastatin (LIPITOR) 80 MG tablet, Take by mouth., Disp: , Rfl:    famotidine (PEPCID) 20 MG tablet, Take 1 tablet (20 mg total) by mouth 2 (two) times daily., Disp: 40 tablet, Rfl: 0   fluticasone (FLONASE) 50 MCG/ACT nasal spray, Place 2 sprays into both nostrils daily., Disp: 16 g, Rfl: 0   glucose blood (ONE TOUCH ULTRA TEST) test strip, USE TWICE DAILY AS DIRECTED, Disp: , Rfl:    insulin glargine (LANTUS) 100 UNIT/ML injection, Inject into the skin., Disp: , Rfl:    Insulin Pen Needle (BD PEN NEEDLE NANO U/F) 32G X 4 MM MISC, Use to inject insulin daily, Disp: , Rfl:    lisinopril (PRINIVIL,ZESTRIL) 20 MG tablet, Take by mouth., Disp: , Rfl:    metFORMIN (GLUCOPHAGE) 500 MG tablet, Take 1 tablet by mouth daily with breakfast, Disp: , Rfl:    nitroGLYCERIN (NITROSTAT) 0.4 MG SL tablet, Place under the tongue., Disp: , Rfl:    promethazine-dextromethorphan (PROMETHAZINE-DM) 6.25-15 MG/5ML syrup,  Take 5 mLs by mouth 4 (four) times daily as needed for cough., Disp: 118 mL, Rfl: 0   Spacer/Aero-Holding Chambers (AEROCHAMBER MV) inhaler, Use as instructed, Disp: 1 each, Rfl: 1   tamsulosin (FLOMAX) 0.4 MG CAPS capsule, Take 0.4 mg by mouth daily., Disp: , Rfl:    traZODone (DESYREL) 50 MG tablet, Take 50 mg by mouth at bedtime., Disp: , Rfl:    Medications ordered in this encounter:  No orders of the defined types were placed in this encounter.    *If you need refills on other medications prior to your next appointment, please contact your pharmacy*  Follow-Up: Call back or seek an in-person evaluation if the symptoms worsen or if the condition fails to improve as anticipated.  [facility] [phone]  Other Instructions   If you have been instructed to have an in-person evaluation today at a local Urgent Care facility, please use the link below. It will take you to a list of all of our available [facility], including address, phone number and hours of operation. Please do not delay care.  [facility]  If you or a family member do not have a primary care provider, use the link below to schedule a visit and establish care. When you choose a [facility] primary care physician or advanced practice provider, you gain a long-term partner in health. Find a Primary Care Provider  Learn  more about [facility] in-office and virtual care options: [facility] - Get Care Now

## 2022-09-04 NOTE — Progress Notes (Signed)
Virtual Visit Consent   Frederick Anthes., you are scheduled for a virtual visit with a [facility] provider today. Just as with appointments in the office, your consent must be obtained to participate. Your consent will be active for this visit and any virtual visit you may have with one of our providers in the next 365 days. If you have a MyChart account, a copy of this consent can be sent to you electronically.  As this is a virtual visit, video technology does not allow for your provider to perform a traditional examination. This may limit your provider's ability to fully assess your condition. If your provider identifies any concerns that need to be evaluated in person or the need to arrange testing (such as labs, EKG, etc.), we will make arrangements to do so. Although advances in technology are sophisticated, we cannot ensure that it will always work on either your end or our end. If the connection with a video visit is poor, the visit may have to be switched to a telephone visit. With either a video or telephone visit, we are not always able to ensure that we have a secure connection.  By engaging in this virtual visit, you consent to the provision of healthcare and authorize for your insurance to be billed (if applicable) for the services provided during this visit. Depending on your insurance coverage, you may receive a charge related to this service.  I need to obtain your verbal consent now. Are you willing to proceed with your visit today? Frederick Bills. has provided verbal consent on 09/04/2022 for a virtual visit (video or telephone). Frederick Valdez, [place]  Date: 09/04/2022 9:00 AM  Virtual Visit via Video Note   I, Frederick Valdez, connected with  Frederick Bring.  ([id number], 05-May-1945) on 09/04/22 at  9:00 AM EDT by a video-enabled telemedicine application and verified that I am speaking with the correct person using two identifiers.  Location: Patient: Virtual  Visit Location Patient: Home Provider: Virtual Visit Location Provider: Home Office   I discussed the limitations of evaluation and management by telemedicine and the availability of in person appointments. The patient expressed understanding and agreed to proceed.    History of Present Illness: Frederick Domine. is a 77 y.o. who identifies as a male who was assigned male at birth, and is being seen today for concern for bronchitis. Notes dry but persistent cough. Denies SOB, chest pain. Denies fever, chills. Some nasal congestion but mild. Denies recent travel or sick contact. Wife on hospice so very concerned about getting sick as he has to take care of her.    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    Medications:  Current Outpatient Medications:    benzonatate (TESSALON) 100 MG capsule, Take 1 capsule (100 mg total) by mouth 3 (three) times daily as needed for cough., Disp: 30 capsule, Rfl: 0  doxycycline (VIBRA-TABS) 100 MG tablet, Take 1 tablet (100 mg total) by mouth 2 (two) times daily., Disp: 14 tablet, Rfl: 0   albuterol (VENTOLIN HFA) 108 (90 Base) MCG/ACT inhaler, Inhale 1-2 puffs into the lungs every 6 (six) hours as needed for wheezing or shortness of breath., Disp: 1 each, Rfl: 0   amLODipine (NORVASC) 10 MG tablet, Take by mouth., Disp: , Rfl:    aspirin EC 81 MG tablet, Take by mouth., Disp: , Rfl:     atorvastatin (LIPITOR) 80 MG tablet, Take by mouth., Disp: , Rfl:    glucose blood (ONE TOUCH ULTRA TEST) test strip, USE TWICE DAILY AS DIRECTED, Disp: , Rfl:    insulin glargine (LANTUS) 100 UNIT/ML injection, Inject into the skin., Disp: , Rfl:    Insulin Pen Needle (BD PEN NEEDLE NANO U/F) 32G X 4 MM MISC, Use to inject insulin daily, Disp: , Rfl:    lisinopril (PRINIVIL,ZESTRIL) 20 MG tablet, Take by mouth., Disp: , Rfl:    metFORMIN (GLUCOPHAGE) 500 MG tablet, Take 1 tablet by mouth daily with breakfast, Disp: , Rfl:    nitroGLYCERIN (NITROSTAT) 0.4 MG SL tablet, Place under the tongue., Disp: , Rfl:    Spacer/Aero-Holding Chambers (AEROCHAMBER MV) inhaler, Use as instructed, Disp: 1 each, Rfl: 1   traZODone (DESYREL) 50 MG tablet, Take 50 mg by mouth at bedtime., Disp: , Rfl:   Observations/Objective: Patient is well-developed, well-nourished in no acute distress.  Resting comfortably  at home.  Head is normocephalic, atraumatic.  No labored breathing.  Speech is clear and coherent with logical content.  Patient is alert and oriented at baseline.   Assessment and Plan: 1. Acute bronchitis, unspecified organism - doxycycline (VIBRA-TABS) 100 MG tablet; Take 1 tablet (100 mg total) by mouth 2 (two) times daily.  Dispense: 14 tablet; Refill: 0 - benzonatate (TESSALON) 100 MG capsule; Take 1 capsule (100 mg total) by mouth 3 (three) times daily as needed for cough.  Dispense: 30 capsule; Refill: 0  Discussed most likely viral. He agrees to get and take a home COVID test to be cautious and let us know if this comes back positive. Supportive measures and OTC medications reviewed. Tessalon per orders. He is very concerned about this progressing due to his medical history and that his wife was just placed in hospice care and he is otherwise her primary caregiver. As such will have him start above recommendations. If COVID negative and symptoms continuing to progress after next 48-72 hours, he  can start Doxycycline I have placed on file at the pharmacy for him, taking as directed.   Follow Up Instructions: I discussed the assessment and treatment plan with the patient. The patient was provided an opportunity to ask questions and all were answered. The patient agreed with the plan and demonstrated an understanding of the instructions.  A copy of instructions were sent to the patient via MyChart unless otherwise noted below.   The patient was advised to call back or seek an in-person evaluation if the symptoms worsen or if the condition fails to improve as anticipated.  Time:  I spent 10 minutes with the patient via telehealth technology discussing the above problems/concerns.    Frederick Climes, PA-C

## 2023-01-04 ENCOUNTER — Emergency Department
Admission: EM | Admit: 2023-01-04 | Discharge: 2023-01-04 | Disposition: A | Discharge status: Home / Self Care | Payer: Medicare HMO | Attending: Emergency Medicine | Admitting: Emergency Medicine

## 2023-01-04 ENCOUNTER — Other Ambulatory Visit: Payer: Self-pay

## 2023-01-04 ENCOUNTER — Encounter: Payer: Self-pay | Admitting: Emergency Medicine

## 2023-01-04 DIAGNOSIS — E119 Type 2 diabetes mellitus without complications: Secondary | ICD-10-CM | POA: Diagnosis not present

## 2023-01-04 DIAGNOSIS — I2510 Atherosclerotic heart disease of native coronary artery without angina pectoris: Secondary | ICD-10-CM | POA: Diagnosis not present

## 2023-01-04 DIAGNOSIS — I10 Essential (primary) hypertension: Secondary | ICD-10-CM | POA: Diagnosis not present

## 2023-01-04 DIAGNOSIS — N3000 Acute cystitis without hematuria: Secondary | ICD-10-CM | POA: Diagnosis not present

## 2023-01-04 DIAGNOSIS — R350 Frequency of micturition: Secondary | ICD-10-CM | POA: Diagnosis present

## 2023-01-04 DIAGNOSIS — N390 Urinary tract infection, site not specified: Secondary | ICD-10-CM

## 2023-01-04 LAB — URINALYSIS, ROUTINE W REFLEX MICROSCOPIC
Bilirubin Urine: NEGATIVE
Glucose, UA: >=500 mg/dL — AB
Hgb urine dipstick: NEGATIVE
Ketones, ur: NEGATIVE mg/dL
Nitrite: POSITIVE — AB
Protein, ur: NEGATIVE mg/dL
Specific Gravity, Urine: 1.016 (ref 1.005–1.030)
WBC, UA: >50 WBC/hpf (ref 0–5)
pH: 6 (ref 5.0–8.0)

## 2023-01-04 LAB — CBC
HCT: 45.2 % (ref 39.0–52.0)
Hemoglobin: 16 g/dL (ref 13.0–17.0)
MCH: 32.5 pg (ref 26.0–34.0)
MCHC: 35.4 g/dL (ref 30.0–36.0)
MCV: 91.7 fL (ref 80.0–100.0)
Platelets: 182 10*3/uL (ref 150–400)
RBC: 4.93 MIL/uL (ref 4.22–5.81)
RDW: 12 % (ref 11.5–15.5)
WBC: 17.5 10*3/uL — ABNORMAL HIGH (ref 4.0–10.5)
nRBC: 0 % (ref 0.0–0.2)

## 2023-01-04 LAB — BASIC METABOLIC PANEL
Anion gap: 9 (ref 5–15)
BUN: 29 mg/dL — ABNORMAL HIGH (ref 8–23)
CO2: 26 mmol/L (ref 22–32)
Calcium: 9.2 mg/dL (ref 8.9–10.3)
Chloride: 100 mmol/L (ref 98–111)
Creatinine, Ser: 1.71 mg/dL — ABNORMAL HIGH (ref 0.61–1.24)
GFR, Estimated: 41 mL/min — ABNORMAL LOW (ref >60)
Glucose, Bld: 197 mg/dL — ABNORMAL HIGH (ref 70–99)
Potassium: 4.2 mmol/L (ref 3.5–5.1)
Sodium: 135 mmol/L (ref 135–145)

## 2023-01-04 MED ORDER — CEFDINIR 300 MG PO CAPS
300.0000 mg | ORAL_CAPSULE | Freq: Once | ORAL | Status: AC
  Administered 2023-01-04: 300 mg via ORAL
  Filled 2023-01-04: qty 1

## 2023-01-04 MED ORDER — CEFDINIR 300 MG PO CAPS
300.0000 mg | ORAL_CAPSULE | Freq: Two times a day (BID) | ORAL | 0 refills | Status: AC
Start: 2023-01-04 — End: 2023-01-11

## 2023-01-04 NOTE — ED Provider Notes (Signed)
[facility] Provider Note   Event Date/Time   First MD Initiated Contact with Patient 01/04/23 1554     (approximate) History  Urinary Frequency  HPI Frederick Valdez. is a 77 y.o. male with a past medical history of CAD, hypercholesterolemia, hypertension, and type 2 diabetes who presents complaining of generalized weakness with associated polyuria and dysuria that has been present over the last 4 days.  Patient states that he began having bilateral lower extremity aching and chills that began this morning at 11 AM ROS: Patient currently denies any vision changes, tinnitus, difficulty speaking, facial droop, sore throat, chest pain, shortness of breath, abdominal pain, nausea/vomiting/diarrhea, or numbness/paresthesias in any extremity   Physical Exam  Triage Vital Signs: ED Triage Vitals [01/04/23 1313]  Encounter Vitals Group     BP (!) 160/79     Systolic BP Percentile      Diastolic BP Percentile      Pulse Rate (!) 103     Resp 18     Temp 98.7 F (37.1 C)     Temp Source Oral     SpO2 97 %     Weight 168 lb (76.2 kg)     Height 5\' 3"  (1.6 m)     Head Circumference      Peak Flow      Pain Score 2     Pain Loc      Pain Education      Exclude from Growth Chart    Most recent vital signs: Vitals:   01/04/23 1313  BP: (!) 160/79  Pulse: (!) 103  Resp: 18  Temp: 98.7 F (37.1 C)  SpO2: 97%   General: Awake, oriented x4. CV:  Good peripheral perfusion.  Resp:  Normal effort.  Abd:  No distention.  Other:  Elderly overweight Caucasian male resting comfortably in no acute distress ED Results / Procedures / Treatments  Labs (all labs ordered are listed, but only abnormal results are displayed) Labs Reviewed  URINALYSIS, ROUTINE W REFLEX MICROSCOPIC - Abnormal; Notable for the following components:      Result Value   Color, Urine YELLOW (*)    APPearance HAZY (*)    Glucose, UA >=500 (*)    Nitrite POSITIVE (*)    Leukocytes,Ua  LARGE (*)    Bacteria, UA RARE (*)    All other components within normal limits  BASIC METABOLIC PANEL - Abnormal; Notable for the following components:   Glucose, Bld 197 (*)    BUN 29 (*)    Creatinine, Ser 1.71 (*)    GFR, Estimated 41 (*)    All other components within normal limits  CBC - Abnormal; Notable for the following components:   WBC 17.5 (*)    All other components within normal limits  PROCEDURES: Critical Care performed: No Procedures MEDICATIONS ORDERED IN ED: Medications  cefdinir (OMNICEF) capsule 300 mg (has no administration in time range)   IMPRESSION / MDM / ASSESSMENT AND PLAN / ED COURSE  I reviewed the triage vital signs and the nursing notes.                             The patient is on the cardiac monitor to evaluate for evidence of arrhythmia and/or significant heart rate changes. Patient's presentation is most consistent with acute presentation with potential threat to life or bodily function.  No e/o epididymo-orchitis on exam and low  suspicion for rectal abscess, prostatitis, other GU deep space infection, gonorrhea/chlamydia. Unlikely Infected Urolithiasis, AAA, cholecystitis, pancreatitis, SBO, appendicitis, or other acute abdomen. Workup: UA: None Rx: Cefdinir 300 mg twice daily x7 days  Disposition: Discharge home. SRP discussed. Advise follow up with primary care provider within 24-72 hours.   FINAL CLINICAL IMPRESSION(S) / ED DIAGNOSES   Final diagnoses:  Urinary tract infection without hematuria, site unspecified   Rx / DC Orders   ED Discharge Orders          Ordered    cefdinir (OMNICEF) 300 MG capsule  2 times daily        01/04/23 1558           Note:  This document was prepared using Dragon voice recognition software and may include unintentional dictation errors.   Merwyn Katos, MD 01/04/23 [phone]

## 2023-01-04 NOTE — ED Triage Notes (Signed)
Patient to ED via Pov for urinary frequency and burning with urination. Also c/o bilateral leg aching and chills. Symptoms started at 11am.

## 2023-11-17 IMAGING — CR DG CHEST 2V
2 series · 2 of 2 positions shown · non-contrast
Comparison: Chest radiograph 09/12/2008

CLINICAL DATA: Cough for 2 weeks.

EXAM:
CHEST - 2 VIEW

[chest pa]
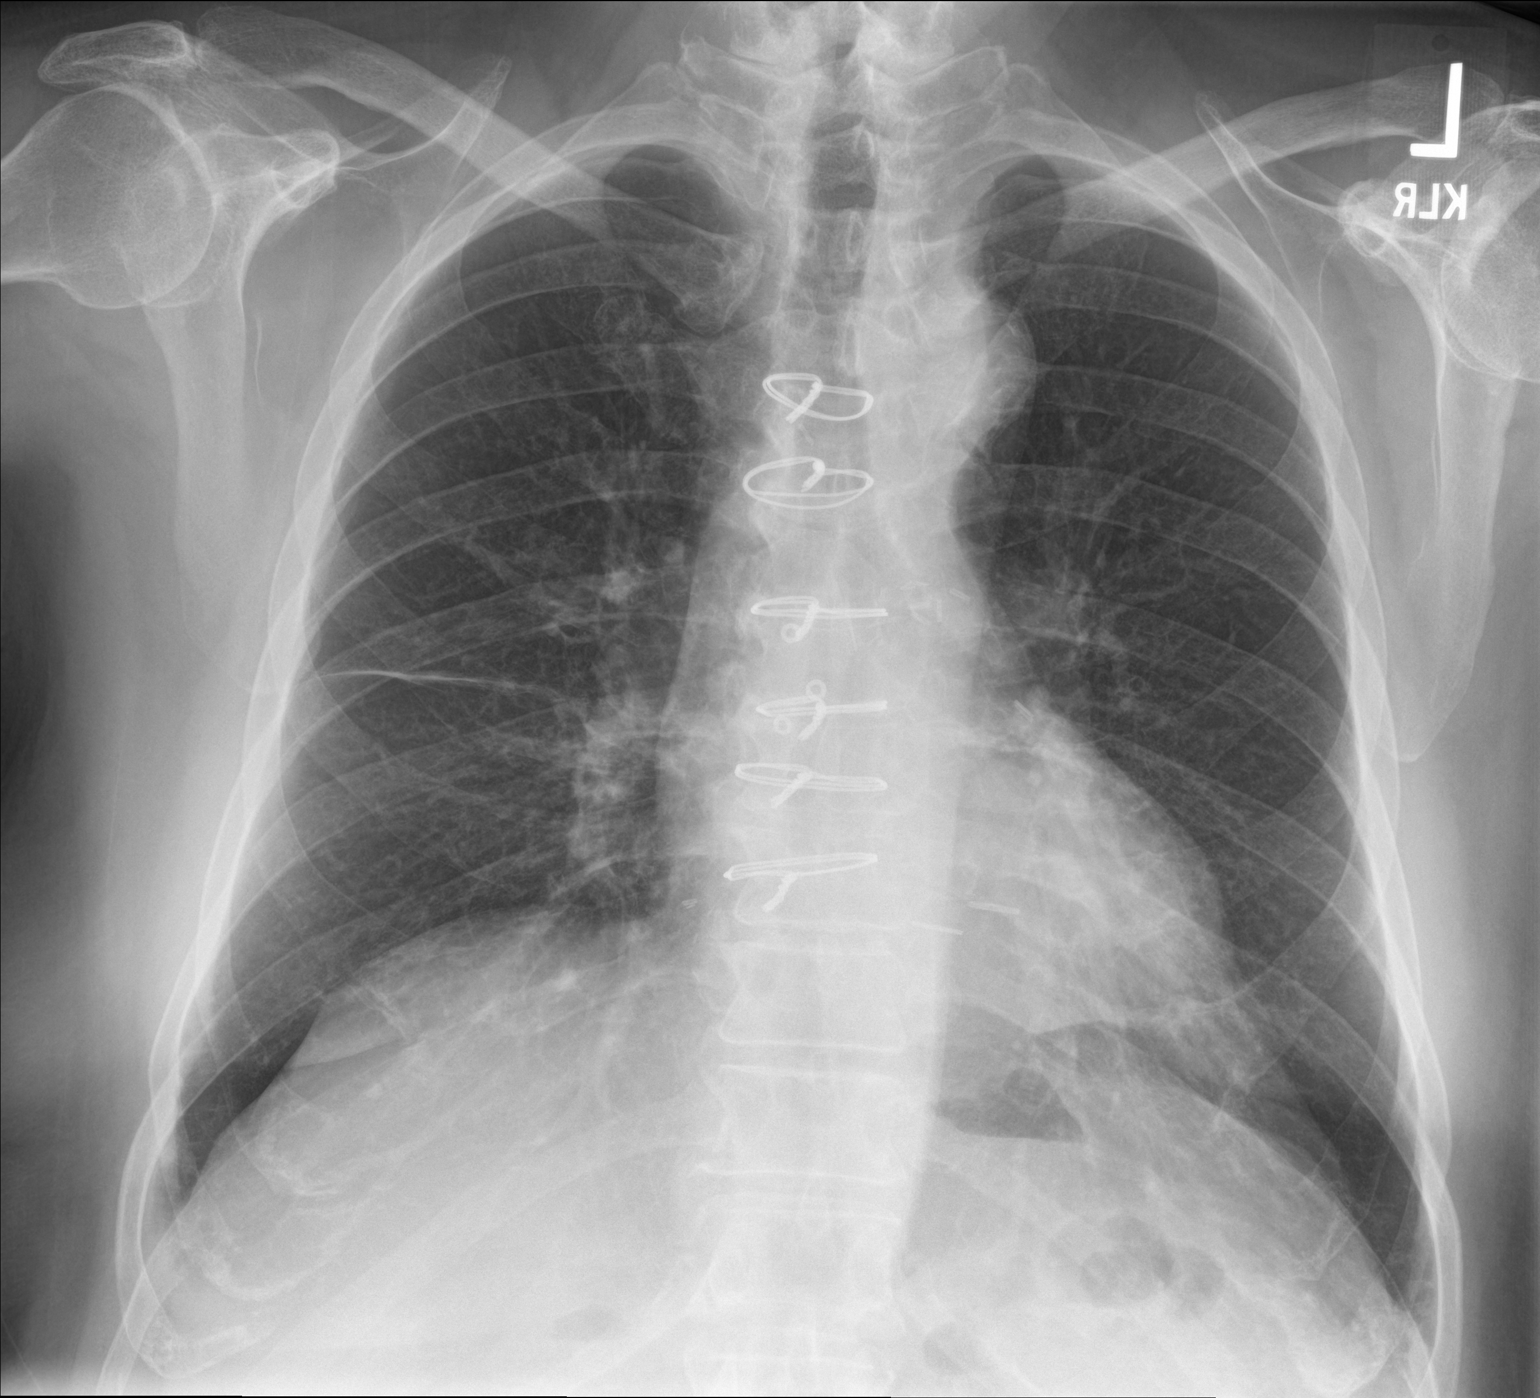

[chest lat]
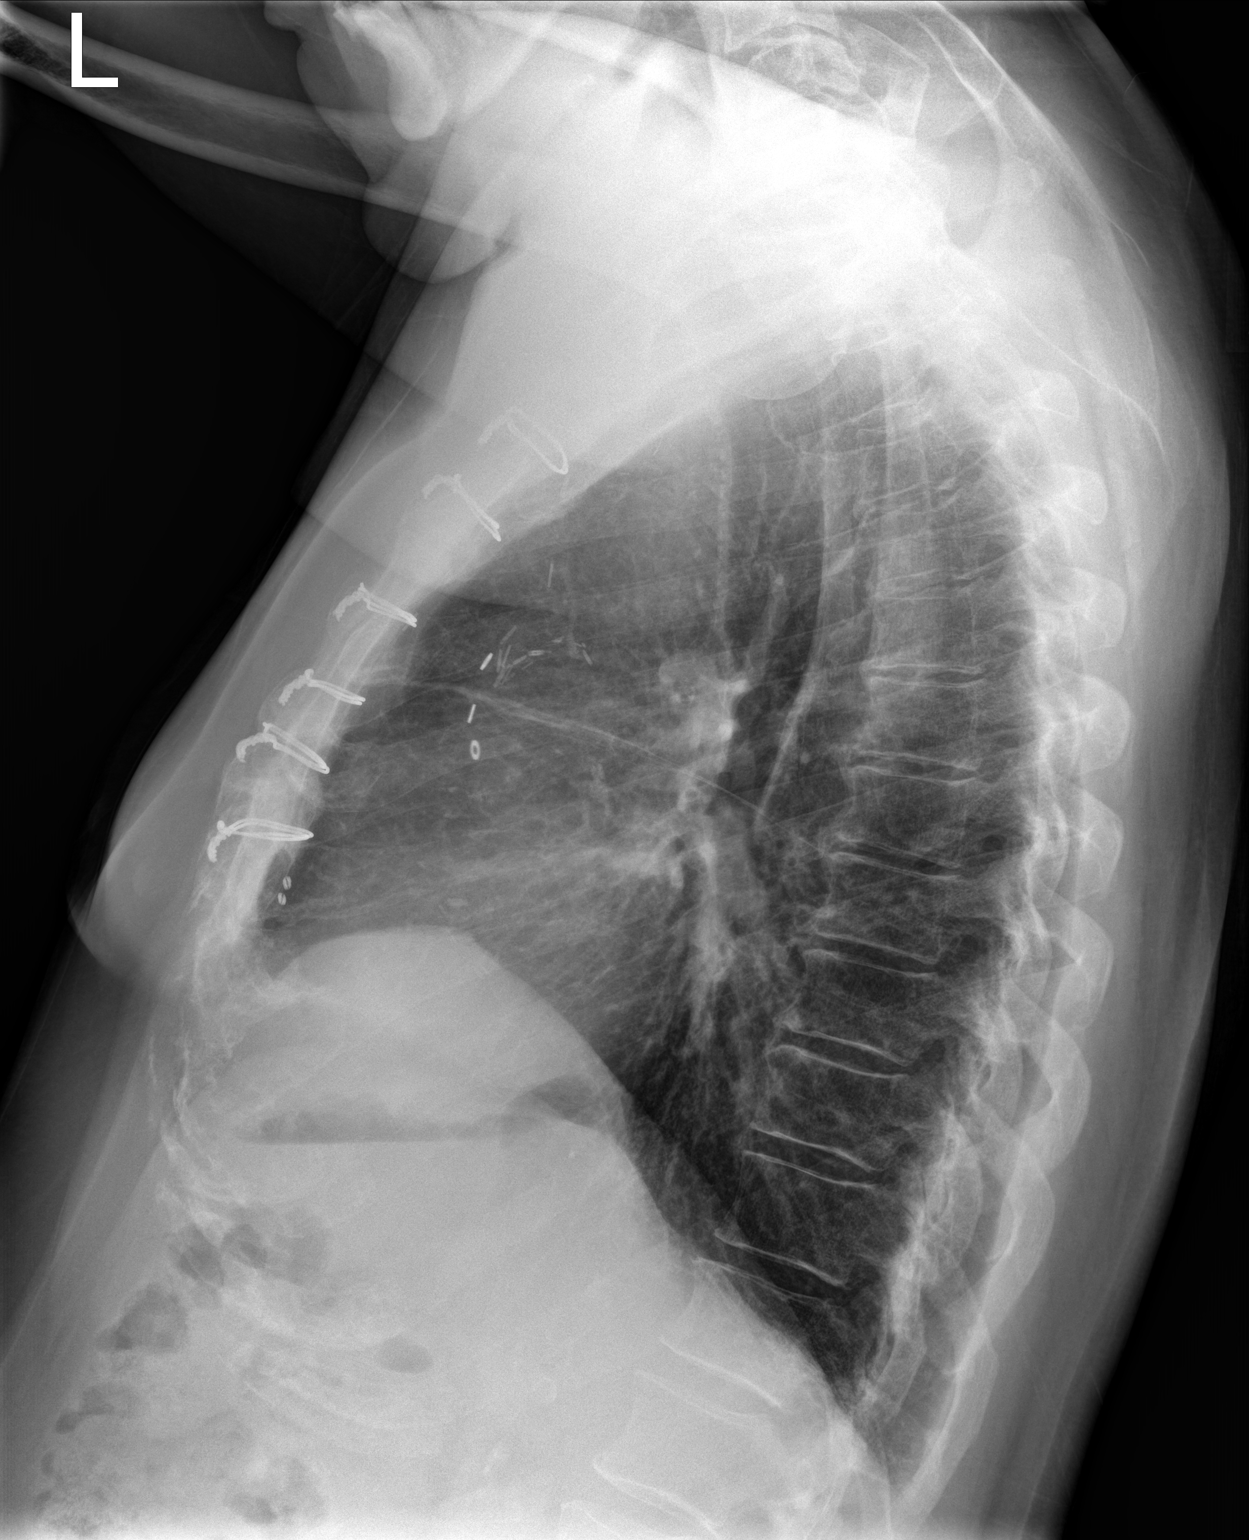

[2 of 2 positions shown; findings below may reference images not displayed]

FINDINGS: Cardiomediastinal silhouette is stable. Heart size is normal. Median
sternotomy wires. Lungs are clear except for thickening/density
along the right minor fissure which could represent chronic changes.
No pulmonary edema. No large pleural effusions. No acute bone
abnormality.
IMPRESSION: No active cardiopulmonary disease.
